# Patient Record
Sex: Male | Born: 1975 | Race: Black or African American | Hispanic: No | Marital: Single | Smoking: Never smoker
Health system: Southern US, Community
[De-identification: ages and names within clinical notes are randomized; demographics above are authoritative.]

## PROBLEM LIST (undated history)

## (undated) DIAGNOSIS — I10 Essential (primary) hypertension: Secondary | ICD-10-CM

---

## 1999-04-06 ENCOUNTER — Emergency Department (HOSPITAL_COMMUNITY): Admission: EM | Admit: 1999-04-06 | Discharge: 1999-04-06 | Payer: Self-pay | Admitting: *Deleted

## 2005-07-06 ENCOUNTER — Encounter: Admission: RE | Admit: 2005-07-06 | Discharge: 2005-07-06 | Payer: Self-pay | Admitting: Orthopedic Surgery

## 2012-04-28 ENCOUNTER — Ambulatory Visit (HOSPITAL_BASED_OUTPATIENT_CLINIC_OR_DEPARTMENT_OTHER): Payer: 59 | Attending: Otolaryngology

## 2012-04-28 VITALS — Ht 66.75 in | Wt 270.0 lb

## 2012-04-28 DIAGNOSIS — G4733 Obstructive sleep apnea (adult) (pediatric): Secondary | ICD-10-CM

## 2012-05-02 DIAGNOSIS — R0609 Other forms of dyspnea: Secondary | ICD-10-CM

## 2012-05-02 DIAGNOSIS — G4733 Obstructive sleep apnea (adult) (pediatric): Secondary | ICD-10-CM

## 2012-05-02 DIAGNOSIS — R0989 Other specified symptoms and signs involving the circulatory and respiratory systems: Secondary | ICD-10-CM

## 2012-05-03 NOTE — Procedures (Signed)
NAME:  Kyle Zavala, Kyle Zavala NO.:  [id number]  MEDICAL RECORD NO.:  [id number]          PATIENT TYPE:  OUT  LOCATION:  SLEEP CENTER                 FACILITY:  [facility]  PHYSICIAN:  Clinton D. Maple Hudson, MD, FCCP, FACPDATE OF BIRTH:  Aug 04[birth year] 1974  DATE OF STUDY:  04/28/2012                           NOCTURNAL POLYSOMNOGRAM  REFERRING PHYSICIAN:  Hermelinda Medicus, M.D.  INDICATION FOR STUDY:  Hypersomnia with sleep apnea.  EPWORTH SLEEPINESS SCORE:  3/24.  BMI 42.3, weight 270 pounds, height 67 inches, neck 17.5 inches.  MEDICATIONS:  Home medications are charted as "none documented."  SLEEP ARCHITECTURE:  Total sleep time 23.5 minutes with sleep efficiency 60.7%.  Stage I was 16.8%, stage II 76.1%, stage III 1.3%.  REM 5.8% of total sleep time.  Sleep latency of 77 minutes.  REM latency 76 minutes. Awake after sleep onset 68.5 minutes.  Arousal index 25.5.  Bedtime medication:  None.  RESPIRATORY DATA:  Apnea-hypopnea index (AHI) 61.7 per hour.  A total of 230 events was scored including 124 obstructive apneas, 10 central apneas, 4 mixed apneas, 92 hypopneas.  Events were not positional.  REM AHI 83.1 per hour.  The patient met criteria for split protocol CPAP titration, but the patient removed the mask and requested the study be continued without CPAP.  OXYGEN DATA:  Loud snoring with oxygen desaturation to a nadir of 77% and mean oxygen saturation through the study of 93.2% on room air.  CARDIAC DATA:  Normal sinus rhythm.  MOVEMENT/PARASOMNIA:  No significant movement disturbance.  Bathroom x1.  IMPRESSION/RECOMMENDATION: 1. Severe obstructive sleep apnea/hypopnea syndrome, AHI 61.7 per hour     with non-positional events.  Loud snoring with oxygen desaturation     to a nadir of 77% and mean oxygen saturation through the study of     93.2% on room air. 2. The patient qualified for a split protocol CPAP titration as     ordered, but took his mask off and asked to be  studied without it.     Clinton D. Maple Hudson, MD, [facility], FACP Diplomate, American Board of Sleep Medicine    CDY/MEDQ  D:  05/02/2012 15:06:13  T:  05/03/2012 00:00:10  Job:  [id number]

## 2018-03-06 ENCOUNTER — Encounter (HOSPITAL_COMMUNITY): Payer: Self-pay

## 2018-03-06 ENCOUNTER — Emergency Department (HOSPITAL_COMMUNITY)
Admission: EM | Admit: 2018-03-06 | Discharge: 2018-03-06 | Disposition: A | Discharge status: Home / Self Care | Payer: Self-pay | Attending: Emergency Medicine | Admitting: Emergency Medicine

## 2018-03-06 ENCOUNTER — Emergency Department (HOSPITAL_COMMUNITY): Payer: Self-pay

## 2018-03-06 DIAGNOSIS — I10 Essential (primary) hypertension: Secondary | ICD-10-CM | POA: Insufficient documentation

## 2018-03-06 DIAGNOSIS — J019 Acute sinusitis, unspecified: Secondary | ICD-10-CM | POA: Insufficient documentation

## 2018-03-06 HISTORY — DX: Essential (primary) hypertension: I10

## 2018-03-06 LAB — I-STAT CHEM 8, ED
BUN: 13 mg/dL (ref 6–20)
Calcium, Ion: 1.2 mmol/L (ref 1.15–1.40)
Chloride: 104 mmol/L (ref 98–111)
Creatinine, Ser: 1.2 mg/dL (ref 0.61–1.24)
Glucose, Bld: 82 mg/dL (ref 70–99)
HCT: 44 % (ref 39.0–52.0)
Hemoglobin: 15 g/dL (ref 13.0–17.0)
Potassium: 3.6 mmol/L (ref 3.5–5.1)
Sodium: 139 mmol/L (ref 135–145)
TCO2: 27 mmol/L (ref 22–32)

## 2018-03-06 LAB — I-STAT TROPONIN, ED: Troponin i, poc: 0 ng/mL (ref 0.00–0.08)

## 2018-03-06 LAB — CBC
HCT: 43.4 % (ref 39.0–52.0)
Hemoglobin: 14 g/dL (ref 13.0–17.0)
MCH: 28.5 pg (ref 26.0–34.0)
MCHC: 32.3 g/dL (ref 30.0–36.0)
MCV: 88.2 fL (ref 80.0–100.0)
Platelets: 305 10*3/uL (ref 150–400)
RBC: 4.92 MIL/uL (ref 4.22–5.81)
RDW: 13.8 % (ref 11.5–15.5)
WBC: 6.9 10*3/uL (ref 4.0–10.5)
nRBC: 0 % (ref 0.0–0.2)

## 2018-03-06 MED ORDER — HYDROXYZINE HCL 25 MG PO TABS
25.0000 mg | ORAL_TABLET | Freq: Once | ORAL | Status: AC
  Administered 2018-03-06: 25 mg via ORAL
  Filled 2018-03-06: qty 1

## 2018-03-06 MED ORDER — HYDROCHLOROTHIAZIDE 12.5 MG PO CAPS: 12.5000 mg | ORAL_CAPSULE | Freq: Every day | ORAL | 0 refills | Status: AC

## 2018-03-06 MED ORDER — DOXYCYCLINE HYCLATE 100 MG PO CAPS: 100.0000 mg | ORAL_CAPSULE | Freq: Two times a day (BID) | ORAL | 0 refills | Status: AC

## 2018-03-06 MED ORDER — ASPIRIN 81 MG PO CHEW
324.0000 mg | CHEWABLE_TABLET | Freq: Once | ORAL | Status: AC
  Administered 2018-03-06: 324 mg via ORAL
  Filled 2018-03-06: qty 4

## 2018-03-06 MED ORDER — FLUTICASONE PROPIONATE 50 MCG/ACT NA SUSP: 1.0000 | Freq: Every day | NASAL | 0 refills | Status: AC

## 2018-03-06 MED ORDER — HYDROCHLOROTHIAZIDE 12.5 MG PO CAPS
12.5000 mg | ORAL_CAPSULE | Freq: Once | ORAL | Status: AC
  Administered 2018-03-06: 12.5 mg via ORAL
  Filled 2018-03-06: qty 1

## 2018-03-06 NOTE — ED Notes (Signed)
This RN has tired BP on left arm, right arm, and left forearm. Also tried large cuff and regular adult long cuff. Also manual BP is documented.

## 2018-03-06 NOTE — ED Triage Notes (Addendum)
Patient c/o stress/anxiety and hypertension on and off for a year. C/o left upper chest pressure.  Denies radiation.   A/Ox4 Ambulatory in triage.   Prescribed amlodipine several years ago but never took it.

## 2018-03-06 NOTE — ED Provider Notes (Signed)
[facility]-EMERGENCY DEPT Provider Note   CSN: [id number] Arrival date & time: 03/06/18  1111     History   Chief Complaint No chief complaint on file.   HPI Kyle Zavala is a 43 y.o. male.  HPI  43 year old male, with a PMH of HTN, presents with high blood pressure.  Patient states he was routinely checking his blood pressure this morning when he was noted his blood pressure was elevated.   Patient states he then started crying and having chest pain.  He describes the pain as a sharp nonradiating pain on the left side of the chest, pinpoint.  He denies any associated shortness of breath, nausea, vomiting.  He states once he stops crying the chest pain improved.  He notes no chest pain at this time.  He states he has been very stressed at home and he thinks this might be worsening his blood pressure.  He has been previously prescribed amlodipine but has never taken it because he is worried about getting the side effects.  Denies any fevers, chills, abdominal pain, nausea, vomiting.  Denies no family history of MI.  He has no personal history of HLD, diabetes, MI.   Past Medical History:  Diagnosis Date  . Hypertension     There are no active problems to display for this patient.   History reviewed. No pertinent surgical history.      Home Medications    Prior to Admission medications   Not on File    Family History History reviewed. No pertinent family history.  Social History Social History   Tobacco Use  . Smoking status: Never Smoker  . Smokeless tobacco: Never Used  Substance Use Topics  . Alcohol use: Not Currently  . Drug use: Never     Allergies   Patient has no allergy information on record.   Review of Systems Review of Systems  Constitutional: Negative for chills and fever.  HENT: Positive for congestion and rhinorrhea.   Respiratory: Positive for cough. Negative for shortness of breath.   Cardiovascular: Positive for  chest pain.  Gastrointestinal: Negative for abdominal pain, nausea and vomiting.  Genitourinary: Negative for dysuria.  Skin: Negative for rash and wound.  Neurological: Negative for numbness.     Physical Exam Updated Vital Signs BP (!) 169/108 (BP Location: Left Arm)   Pulse 66   Temp 97.9 F (36.6 C) (Oral)   Resp 18   SpO2 99%   Physical Exam Vitals signs and nursing note reviewed.  Constitutional:      Appearance: He is well-developed.  HENT:     Head: Normocephalic and atraumatic.     Right Ear: Tympanic membrane normal.     Left Ear: Tympanic membrane normal.     Nose: Congestion and rhinorrhea present. Rhinorrhea is clear.     Mouth/Throat:     Lips: Pink.     Mouth: Mucous membranes are moist.     Pharynx: Oropharynx is clear. Uvula midline.  Eyes:     Conjunctiva/sclera: Conjunctivae normal.  Neck:     Musculoskeletal: Neck supple.  Cardiovascular:     Rate and Rhythm: Normal rate and regular rhythm.     Heart sounds: Normal heart sounds. No murmur.  Pulmonary:     Effort: Pulmonary effort is normal. No respiratory distress.     Breath sounds: Normal breath sounds. No wheezing or rales.  Abdominal:     General: Bowel sounds are normal. There is no distension.  Palpations: Abdomen is soft.     Tenderness: There is no abdominal tenderness.  Musculoskeletal: Normal range of motion.        General: No tenderness or deformity.  Skin:    General: Skin is warm and dry.     Findings: No erythema or rash.  Neurological:     Mental Status: He is alert and oriented to person, place, and time.  Psychiatric:        Behavior: Behavior normal.      ED Treatments / Results  Labs (all labs ordered are listed, but only abnormal results are displayed) Labs Reviewed  CBC  I-STAT TROPONIN, ED  I-STAT CHEM 8, ED    EKG None  Radiology Dg Chest 2 View  Result Date: 03/06/2018 CLINICAL DATA:  The patient reports panic attack today. EXAM: CHEST - 2 VIEW  COMPARISON:  None. FINDINGS: The lungs are clear. Heart size is normal. No pneumothorax or pleural effusion. No acute or focal bony abnormality. IMPRESSION: Negative chest. Electronically Signed   By: Drusilla Kanner M.D.   On: 03/06/2018 18:05    Procedures Procedures (including critical care time)  Medications Ordered in ED Medications  aspirin chewable tablet 324 mg (324 mg Oral Given 03/06/18 1639)  hydrOXYzine (ATARAX/VISTARIL) tablet 25 mg (25 mg Oral Given 03/06/18 1707)  hydrochlorothiazide (MICROZIDE) capsule 12.5 mg (12.5 mg Oral Given 03/06/18 1831)     Initial Impression / Assessment and Plan / ED Course  I have reviewed the triage vital signs and the nursing notes.  Pertinent labs & imaging results that were available during my care of the patient were reviewed by me and considered in my medical decision making (see chart for details).     Presented with hypertension.  Patient resting comfortably in bed, no acute distress, nontoxic, non-lethargic.  Vital signs noted that patient is hypertensive.  History and physical not consistent with ACS.  His chest pain was sharp in nature and he is chest pain-free at this time.  Has been resolved despite his blood pressure remaining elevated, do not believe that he had hypertensive urgency or emergency.  No acute findings on x-ray.  His troponin is negative.  His EKG has no acute ST changes.  His blood work is unremarkable.  Patient requesting provider to start him on blood pressure medication.  Start patient on hydrochlorothiazide he is agreeable with this plan.  He is ready and stable for discharge.  Addendum: After discharge patient requested provider to come back into the room.  He stated for the last week he has had increased rhinorrhea, nasal discharge and cough.  He states he has chronic sinusitis but often gets infections on top of it.  He denies any fevers, chills, sore throat, ear pain.  Given duration of 1 week of symptoms, will try  antibiotic at this time.  Encouraged symptom management.  Patient still stable for discharge.   Final Clinical Impressions(s) / ED Diagnoses   Final diagnoses:  None    ED Discharge Orders    None       Rueben Bash 03/06/18 2150    Arby Barrette, MD 03/08/18 2218

## 2018-03-06 NOTE — Discharge Instructions (Signed)
Take hydrochlorothiazide daily.  Follow-up with primary care for continued monitoring of your blood pressure.  Return to the ED immediately for new or worsening symptoms, such as chest pain, shortness of breath, nausea, vomiting, fevers or any concerns at all.

## 2018-03-06 NOTE — ED Notes (Addendum)
Patient is now requesting to be seen for URI. PA made aware. PA states patient will have to wait until she has seen her other 2 patients first. Patient states he will wait as long as he can.

## 2018-03-06 NOTE — ED Triage Notes (Signed)
Per EMS-coming from doctor's office waiting for family member-states patient started crying for no reason-states increased stressors-no history of anxiety-has not been diagnosed with HTN but thinks he might have it-complainng of headache

## 2019-11-04 IMAGING — DX DG CHEST 2V
2 series · 2 of 2 positions shown · non-contrast
Comparison: None.

CLINICAL DATA: The patient reports panic attack today.

EXAM:
CHEST - 2 VIEW

[chest pa]
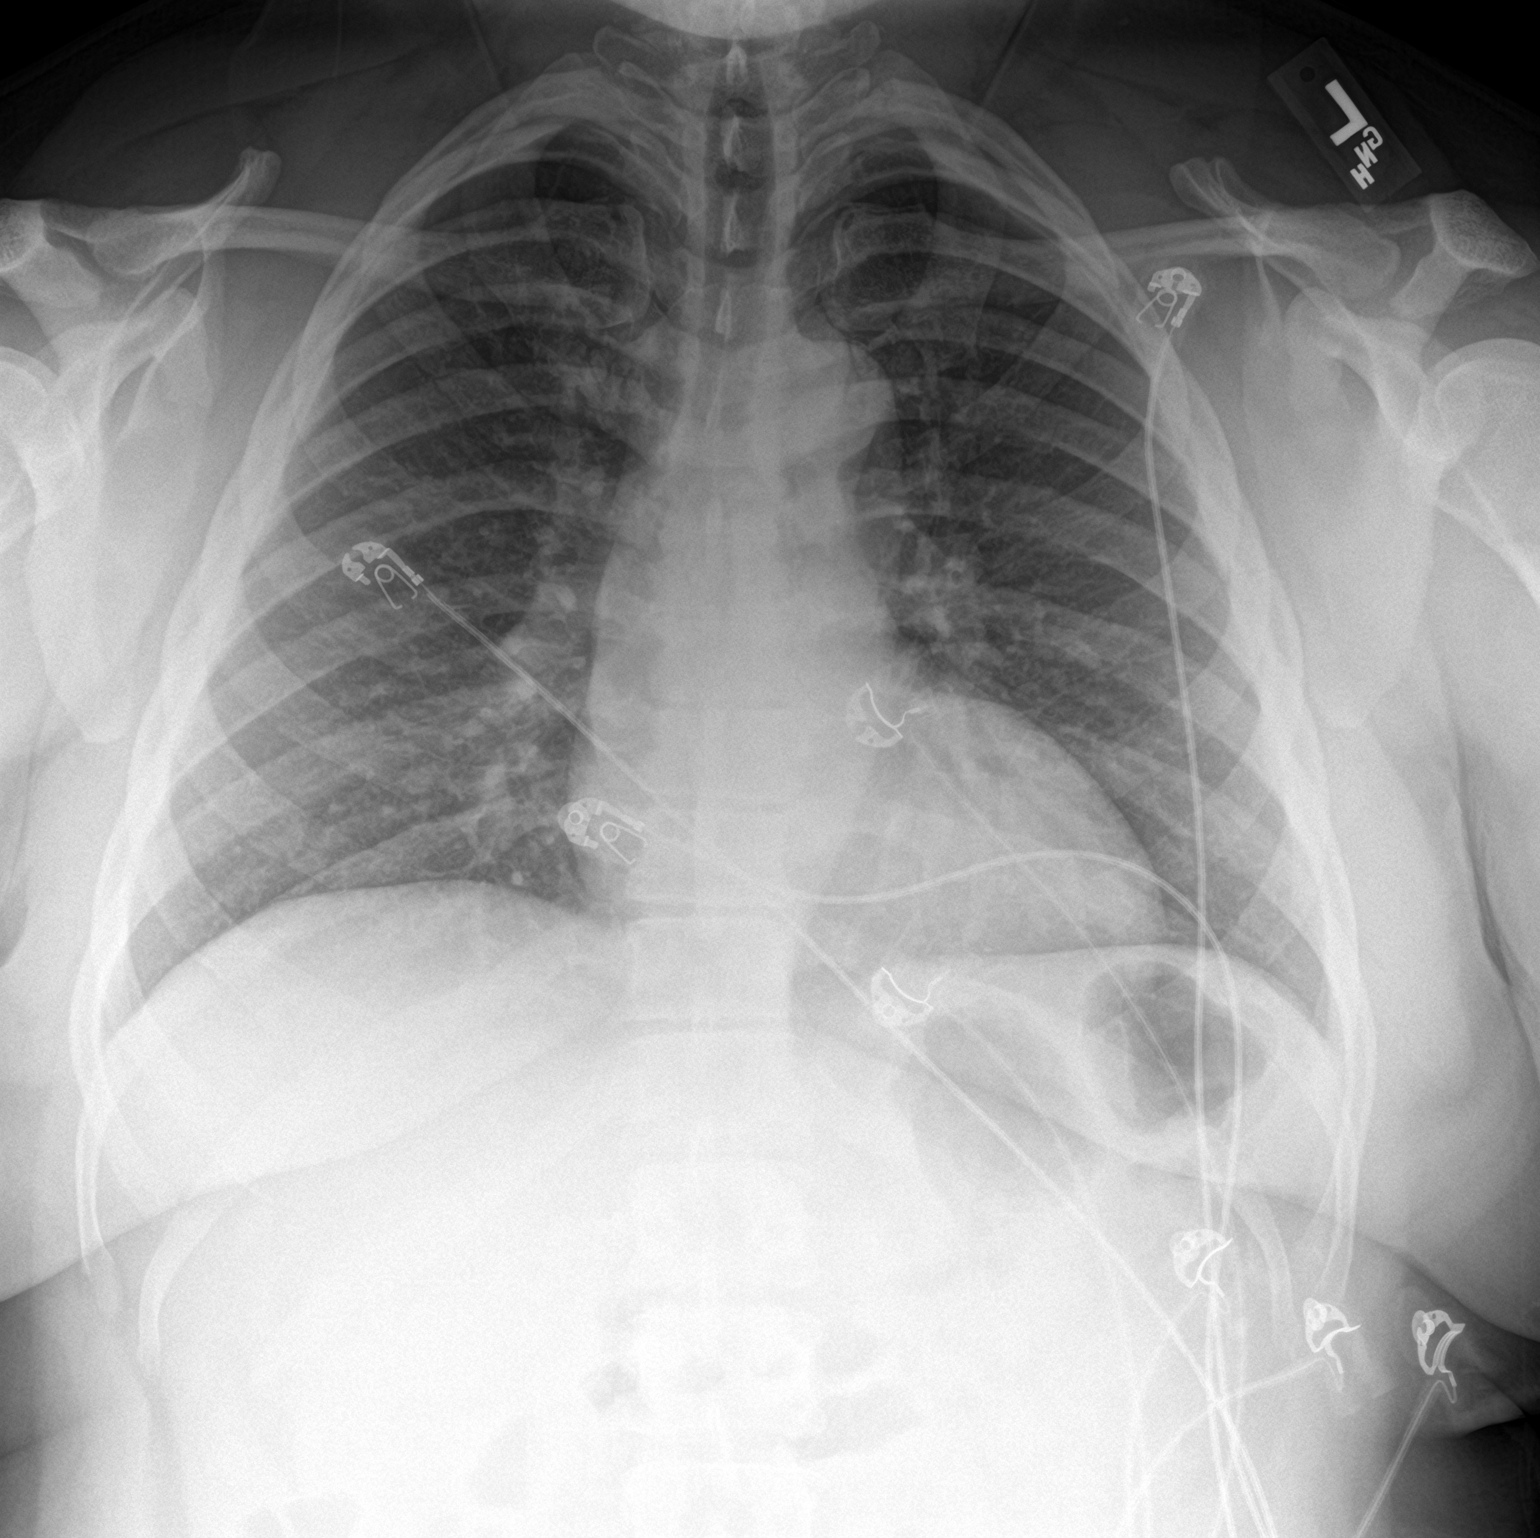

[chest lat]
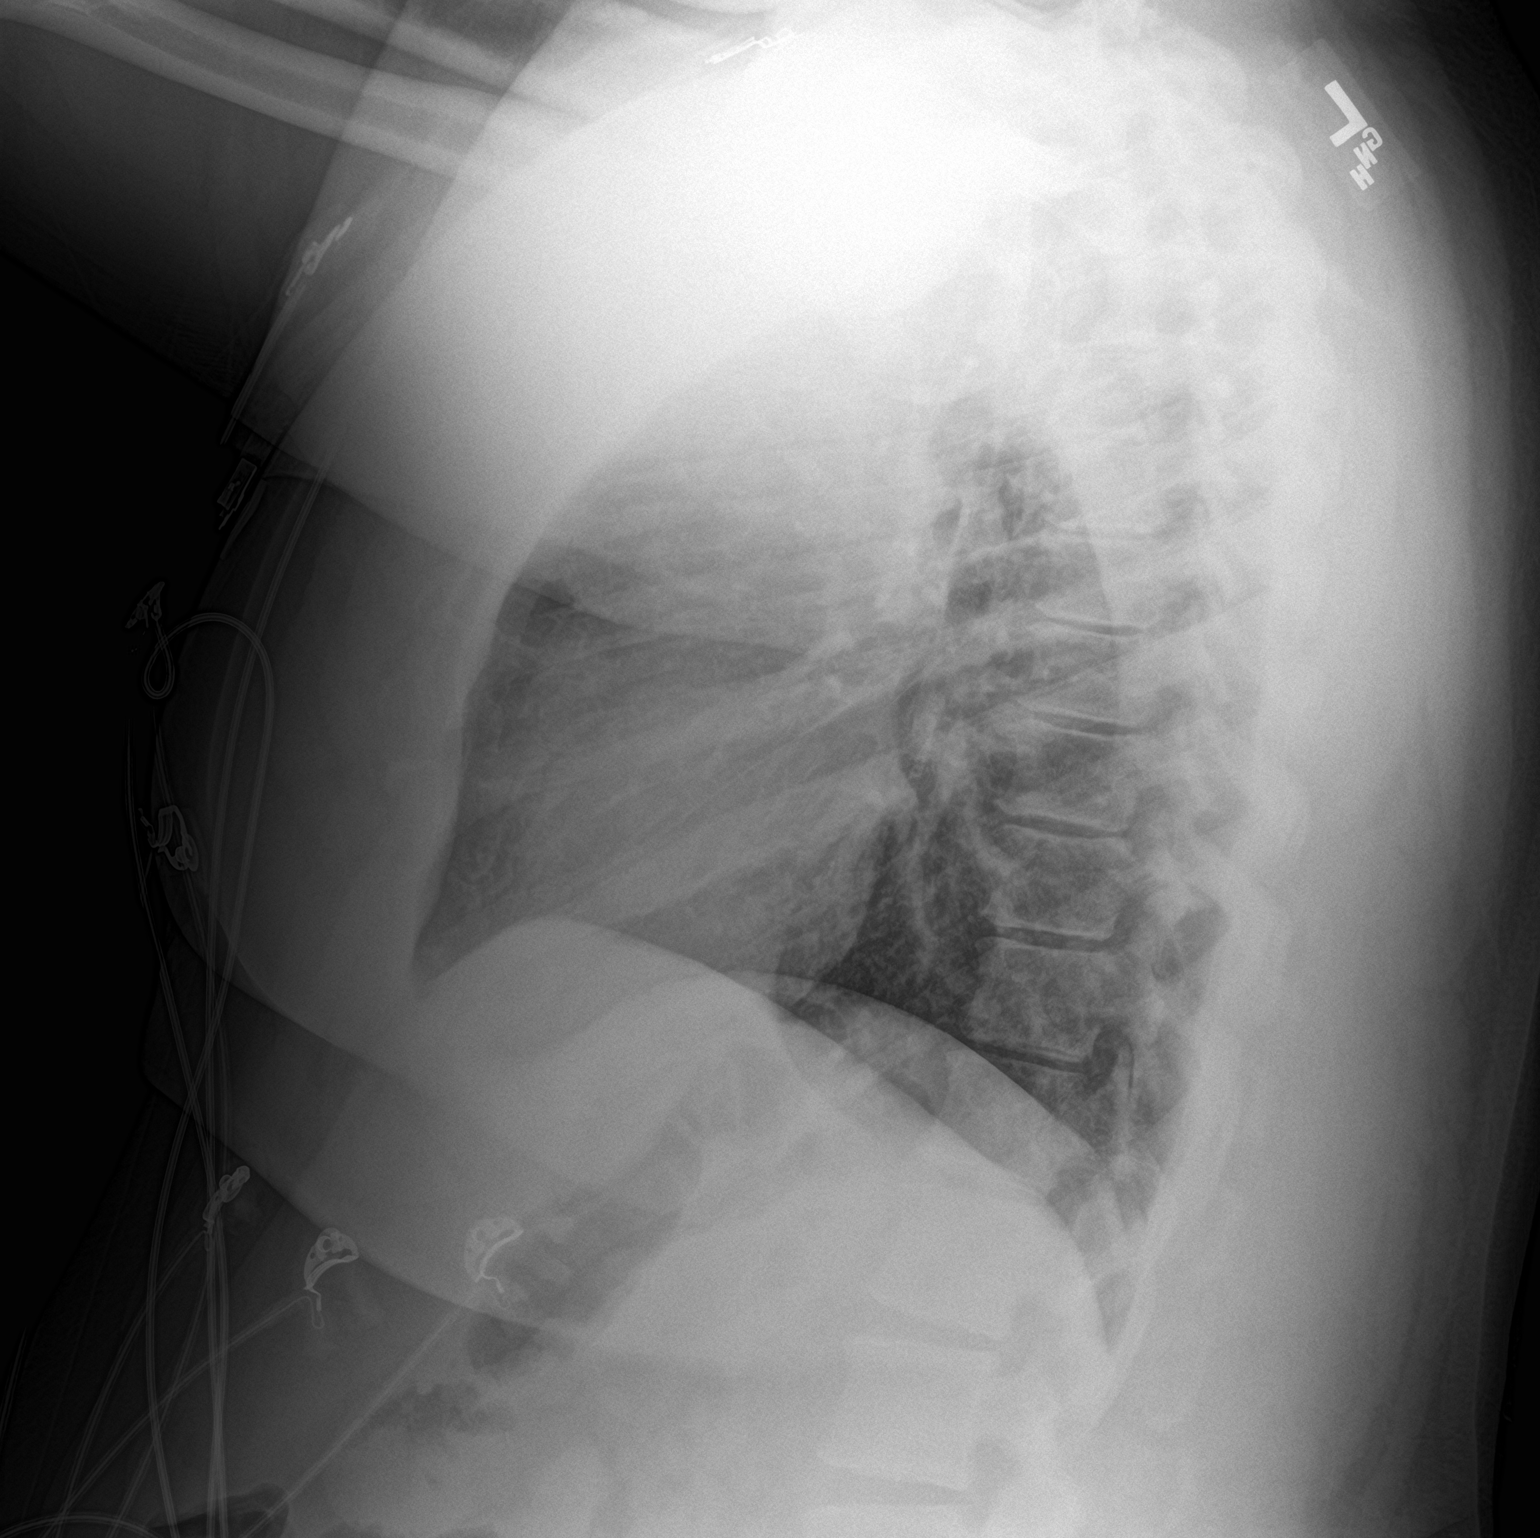

[2 of 2 positions shown; findings below may reference images not displayed]

FINDINGS: The lungs are clear. Heart size is normal. No pneumothorax or
pleural effusion. No acute or focal bony abnormality.
IMPRESSION: Negative chest.

## 2022-10-10 ENCOUNTER — Emergency Department (HOSPITAL_COMMUNITY)
Admission: EM | Admit: 2022-10-10 | Discharge: 2022-10-10 | Disposition: A | Discharge status: Home / Self Care | Payer: Medicaid Other | Attending: Student | Admitting: Student

## 2022-10-10 ENCOUNTER — Other Ambulatory Visit: Payer: Self-pay

## 2022-10-10 DIAGNOSIS — Z79899 Other long term (current) drug therapy: Secondary | ICD-10-CM | POA: Diagnosis not present

## 2022-10-10 DIAGNOSIS — I10 Essential (primary) hypertension: Secondary | ICD-10-CM | POA: Diagnosis not present

## 2022-10-10 DIAGNOSIS — J029 Acute pharyngitis, unspecified: Secondary | ICD-10-CM | POA: Insufficient documentation

## 2022-10-10 DIAGNOSIS — Z1152 Encounter for screening for COVID-19: Secondary | ICD-10-CM | POA: Insufficient documentation

## 2022-10-10 LAB — RESP PANEL BY RT-PCR (RSV, FLU A&B, COVID)  RVPGX2
Influenza A by PCR: NEGATIVE
Influenza B by PCR: NEGATIVE
Resp Syncytial Virus by PCR: NEGATIVE
SARS Coronavirus 2 by RT PCR: NEGATIVE

## 2022-10-10 LAB — GROUP A STREP BY PCR: Group A Strep by PCR: NOT DETECTED

## 2022-10-10 MED ORDER — DEXAMETHASONE SODIUM PHOSPHATE 10 MG/ML IJ SOLN
10.0000 mg | Freq: Once | INTRAMUSCULAR | Status: AC
  Administered 2022-10-10: 10 mg via INTRAMUSCULAR
  Filled 2022-10-10: qty 1

## 2022-10-10 MED ORDER — NAPROXEN 375 MG PO TABS: 375.0000 mg | ORAL_TABLET | Freq: Two times a day (BID) | ORAL | 0 refills | Status: AC

## 2022-10-10 MED ORDER — PENICILLIN G BENZATHINE 1200000 UNIT/2ML IM SUSY
1.2000 10*6.[IU] | PREFILLED_SYRINGE | Freq: Once | INTRAMUSCULAR | Status: AC
  Administered 2022-10-10: 1.2 10*6.[IU] via INTRAMUSCULAR
  Filled 2022-10-10: qty 2

## 2022-10-10 MED ORDER — DEXAMETHASONE 10 MG/ML FOR PEDIATRIC ORAL USE: 10.0000 mg | Freq: Once | INTRAMUSCULAR | Status: DC

## 2022-10-10 MED ORDER — KETOROLAC TROMETHAMINE 15 MG/ML IJ SOLN
15.0000 mg | Freq: Once | INTRAMUSCULAR | Status: AC
  Administered 2022-10-10: 15 mg via INTRAMUSCULAR
  Filled 2022-10-10: qty 1

## 2022-10-10 NOTE — ED Notes (Signed)
Patient verbalizes understanding of discharge instructions. Opportunity for questioning and answers were provided. Armband removed by staff, pt discharged from ED. Pt ambulatory to ED waiting room with steady gait.  

## 2022-10-10 NOTE — ED Triage Notes (Signed)
Pt. Stated, Ive had congestion feeling bad for almost 2 weeks and a bad sore throat for 4 days.

## 2022-10-10 NOTE — ED Provider Notes (Signed)
[place] EMERGENCY DEPARTMENT AT [facility] Provider Note  CSN: [id number] Arrival date & time: 10/10/22 7829  Chief Complaint(s) Sore Throat and Nasal Congestion  HPI Kyle Zavala is a 47 y.o. male with PMH HTN who presents emergency department for evaluation of congestion and sore throat.  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  He states that he has had generalized malaise and fatigue for the last 2 weeks but for the last 4 days has had persistent sore throat.  He has been able to swallow and has full range of motion of his neck.  He denies chest pain, shortness of breath, headache, fever or other systemic symptoms.  Does state that his sister recently endorsed similar symptoms   Past Medical History Past Medical History:  Diagnosis Date   Hypertension    There are no problems to display for this patient.  Home Medication(s) Prior to Admission medications   Medication Sig Start Date End Date Taking? Authorizing Provider  naproxen (NAPROSYN) 375 MG tablet Take 1 tablet (375 mg total) by mouth 2 (two) times daily. 10/10/22  Yes Areg Bialas, MD  fluticasone (FLONASE) 50 MCG/ACT nasal spray Place 1 spray into both nostrils daily for 14 days. 03/06/18 03/20/18  Kendrick, Caitlyn S, PA-C  hydrochlorothiazide (MICROZIDE) 12.5 MG capsule Take 1 capsule (12.5 mg total) by mouth daily. 03/06/18   Clayborne Artist, PA-C                                                                                                                                    Past Surgical History No past surgical history on file. Family History No family history on file.  Social History Social History   Tobacco Use   Smoking status: Never   Smokeless tobacco: Never  Substance Use Topics   Alcohol use: Not Currently   Drug use: Never   Allergies Patient has no allergy information on record.  Review of  Systems Review of Systems  HENT:  Positive for sore throat.   Musculoskeletal:  Positive for arthralgias and myalgias.    Physical Exam Vital Signs  I have reviewed the triage vital signs BP (!) 142/101   Pulse 73   Temp 98.5 F (36.9 C) (Oral)   Resp 18   Ht 5\' 7"  (1.702 m)   Wt 131.5 kg   SpO2 96%   BMI 45.42 kg/m   Physical Exam Vitals and nursing note reviewed.  Constitutional:      General: He is not in acute distress.    Appearance: He is well-developed.  HENT:     Head: Normocephalic and atraumatic.     Mouth/Throat:     Pharynx: Posterior oropharyngeal erythema present.     Tonsils: 1+ on the right. 1+ on the left.  Eyes:     Conjunctiva/sclera: Conjunctivae normal.  Cardiovascular:     Rate and Rhythm: Normal rate and regular rhythm.     Heart sounds: No murmur heard. Pulmonary:     Effort: Pulmonary effort is normal. No respiratory distress.     Breath sounds: Normal breath sounds.  Abdominal:     Palpations: Abdomen is soft.     Tenderness: There is no abdominal tenderness.  Musculoskeletal:        General: No swelling.     Cervical back: Neck supple.  Skin:    General: Skin is warm and dry.     Capillary Refill: Capillary refill takes less than 2 seconds.  Neurological:     Mental Status: He is alert.  Psychiatric:        Mood and Affect: Mood normal.     ED Results and Treatments Labs (all labs ordered are listed, but only abnormal results are displayed) Labs Reviewed  GROUP A STREP BY PCR  RESP PANEL BY RT-PCR (RSV, FLU A&B, COVID)  RVPGX2                                                                                                                          Radiology No results found.  Pertinent labs & imaging results that were available during my care of the patient were reviewed by me and considered in my medical decision making (see MDM for details).  Medications Ordered in ED Medications  penicillin g benzathine (BICILLIN LA)  1200000 UNIT/2ML injection 1.2 Million Units (1.2 Million Units Intramuscular Given 10/10/22 1505)  ketorolac (TORADOL) 15 MG/ML injection 15 mg (15 mg Intramuscular Given 10/10/22 1503)  dexamethasone (DECADRON) injection 10 mg (10 mg Intramuscular Given 10/10/22 1504)                                                                                                                                     Procedures Procedures  (including critical care time)  Medical Decision Making / ED Course   This patient presents to the ED for concern of sore throat, myalgias, this involves an extensive number of treatment options, and is a complaint that carries with it a high risk of complications and morbidity.  The differential diagnosis includes PTA, Retropharyngeal abscess, Ludwig's Angina, Epiglottitis, Bacterial/Viral pharyngitis, Strep Throat, Mononucleosis,  Oral Candidiasis,  MDM: Patient seen emergency room for evaluation of sore throat and myalgias.  Physical exam with 1+ bilateral  tonsillar swelling with oropharyngeal erythema but no uvular deviation or tonsillar exudate.  COVID, flu, RSV negative and obtained in the setting of sore throat with myalgias.  Strep also negative.  Patient given Decadron, Toradol and Bicillin for pharyngitis and is reporting improvement of symptoms.  No evidence of PTA or RPA today.  At this time, patient safe for discharge with outpatient follow-up.  He was given return precautions of which he voiced understanding he was discharged on Naprosyn.   Additional history obtained:  -External records from outside source obtained and reviewed including: Chart review including previous notes, labs, imaging, consultation notes   Lab Tests: -I ordered, reviewed, and interpreted labs.   The pertinent results include:   Labs Reviewed  GROUP A STREP BY PCR  RESP PANEL BY RT-PCR (RSV, FLU A&B, COVID)  [id number]       Medicines ordered and prescription drug management: Meds  ordered this encounter  Medications   penicillin g benzathine (BICILLIN LA) 1200000 UNIT/2ML injection 1.2 Million Units    Order Specific Question:   Antibiotic Indication:    Answer:   Pharyngitis   DISCONTD: dexamethasone (DECADRON) 10 MG/ML injection for Pediatric ORAL use 10 mg   ketorolac (TORADOL) 15 MG/ML injection 15 mg   dexamethasone (DECADRON) injection 10 mg   naproxen (NAPROSYN) 375 MG tablet    Sig: Take 1 tablet (375 mg total) by mouth 2 (two) times daily.    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

## 2022-12-11 ENCOUNTER — Ambulatory Visit (HOSPITAL_COMMUNITY)
Admission: EM | Admit: 2022-12-11 | Discharge: 2022-12-11 | Disposition: A | Discharge status: Home / Self Care | Payer: Medicaid Other

## 2022-12-11 NOTE — Progress Notes (Signed)
   12/11/22 1835  [facility] Triage Screening (Walk-ins at [facility] only)  How Did You Hear About [place]? Self  What Is the Reason for Your Visit/Call Today? Kyle Zavala is a 47 year old male presenting to [facility] unaccompanied. Pt states, "I am extremely stress and I am out of meds (lexipro 20 mg)". 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 Pt does see his therapist weekly and finds it to be helpful. Pt is looking for a medication refill at this time and additional resources to help with his increased anxiety. Pt denies substance use, SI, HI and AVH.  How Long Has This Been Causing You Problems? <Week  Have You Recently Had Any Thoughts About Hurting Yourself? No  Are You Planning to Commit Suicide/Harm Yourself At This time? No  Have you Recently Had Thoughts About Hurting Someone Karolee Ohs? No  Are You Planning To Harm Someone At This Time? No  Are you currently experiencing any auditory, visual or other hallucinations? No  Have You Used Any Alcohol or Drugs in the Past 24 Hours? No  Do you have any current medical co-morbidities that require immediate attention? No  Clinician description of patient physical appearance/behavior: calm, cooperative  What Do You Feel Would Help You the Most Today? Medication(s);Stress Management  If access to [facility] was not available, would you have sought care in the Emergency Department? No  Determination of Need Routine (7 days)  Options For Referral Medication Management

## 2022-12-16 ENCOUNTER — Ambulatory Visit
Admission: EM | Admit: 2022-12-16 | Discharge: 2022-12-16 | Disposition: A | Discharge status: Home / Self Care | Payer: Self-pay

## 2022-12-16 ENCOUNTER — Other Ambulatory Visit: Payer: Self-pay

## 2022-12-16 ENCOUNTER — Encounter: Payer: Self-pay | Admitting: Emergency Medicine

## 2022-12-16 DIAGNOSIS — T7840XA Allergy, unspecified, initial encounter: Secondary | ICD-10-CM

## 2022-12-16 DIAGNOSIS — J069 Acute upper respiratory infection, unspecified: Secondary | ICD-10-CM

## 2022-12-16 MED ORDER — ALBUTEROL SULFATE HFA 108 (90 BASE) MCG/ACT IN AERS: 2.0000 | INHALATION_SPRAY | RESPIRATORY_TRACT | 0 refills | Status: AC | PRN

## 2022-12-16 MED ORDER — ALBUTEROL SULFATE HFA 108 (90 BASE) MCG/ACT IN AERS: 2.0000 | INHALATION_SPRAY | RESPIRATORY_TRACT | 0 refills | Status: DC | PRN

## 2022-12-16 MED ORDER — PROMETHAZINE-DM 6.25-15 MG/5ML PO SYRP: 5.0000 mL | ORAL_SOLUTION | Freq: Four times a day (QID) | ORAL | 0 refills | Status: AC | PRN

## 2022-12-16 MED ORDER — PREDNISONE 10 MG PO TABS: 20.0000 mg | ORAL_TABLET | Freq: Every day | ORAL | 0 refills | Status: AC

## 2022-12-16 NOTE — ED Provider Notes (Signed)
[facility]    CSN: [id number] Arrival date & time: 12/16/22  1500      History   Chief Complaint Chief Complaint  Patient presents with   Cough    HPI Kyle Zavala is a 47 y.o. male.   47 year old male pt, Kyle Zavala, presents to urgent care for evaluation of cough sinus congestion wheezing.  Patient states he is around smoke with his sister and her husband patient denies smoking or vaping himself.  Patient states he has been taken DayQuil and NyQuil and nasal irrigation but is still coughing.  No known illness exposure.   Reports history of using inhaler with URI  The history is provided by the patient. No language interpreter was used.    Past Medical History:  Diagnosis Date   Hypertension     Patient Active Problem List   Diagnosis Date Noted   Viral URI with cough 12/16/2022    History reviewed. No pertinent surgical history.     Home Medications    Prior to Admission medications   Medication Sig Start Date End Date Taking? Authorizing Provider  amLODipine (NORVASC) 10 MG tablet Take 1 tablet by mouth daily. 07/10/22  Yes [provider]  predniSONE (DELTASONE) 10 MG tablet Take 2 tablets (20 mg total) by mouth daily with breakfast for 4 days. X 4 days, Disp # 8 no refills 12/16/22 12/20/22 Yes Kaulin Chaves, Para March, NP  promethazine-dextromethorphan (PROMETHAZINE-DM) 6.25-15 MG/5ML syrup Take 5 mLs by mouth 4 (four) times daily as needed for cough. 12/16/22  Yes Rachelanne Whidby, Para March, NP  albuterol (VENTOLIN HFA) 108 (90 Base) MCG/ACT inhaler Inhale 2 puffs into the lungs every 4 (four) hours as needed for wheezing or shortness of breath. 12/16/22   Maritza Goldsborough, Para March, NP  fluticasone (FLONASE) 50 MCG/ACT nasal spray Place 1 spray into both nostrils daily for 14 days. 03/06/18 03/20/18  Kendrick, Caitlyn S, PA-C  hydrochlorothiazide (MICROZIDE) 12.5 MG capsule Take 1 capsule (12.5 mg total) by mouth daily. 03/06/18   Kendrick, Caitlyn S, PA-C   naproxen (NAPROSYN) 375 MG tablet Take 1 tablet (375 mg total) by mouth 2 (two) times daily. 10/10/22   Kommor, Wyn Forster, MD    Family History History reviewed. No pertinent family history.  Social History Social History   Tobacco Use   Smoking status: Never   Smokeless tobacco: Never  Vaping Use   Vaping status: Never Used  Substance Use Topics   Alcohol use: Not Currently   Drug use: Never     Allergies   Patient has no known allergies.   Review of Systems Review of Systems  Constitutional:  Negative for fever.  HENT:  Positive for congestion.   Respiratory:  Positive for cough and wheezing.   All other systems reviewed and are negative.    Physical Exam Triage Vital Signs ED Triage Vitals  Encounter Vitals Group     BP 12/16/22 1526 (!) 155/76     Systolic BP Percentile --      Diastolic BP Percentile --      Pulse Rate 12/16/22 1526 92     Resp 12/16/22 1526 20     Temp 12/16/22 1526 98.4 F (36.9 C)     Temp Source 12/16/22 1526 Oral     SpO2 12/16/22 1526 96 %     Weight --      Height --      Head Circumference --      Peak Flow --  Pain Score 12/16/22 1523 3     Pain Loc --      Pain Education --      Exclude from Growth Chart --    No data found.  Updated Vital Signs BP (!) 155/76 (BP Location: Left Arm) Comment (BP Location): regular cuff on forearm  Pulse 92   Temp 98.4 F (36.9 C) (Oral)   Resp 20   SpO2 96%   Visual Acuity Right Eye Distance:   Left Eye Distance:   Bilateral Distance:    Right Eye Near:   Left Eye Near:    Bilateral Near:     Physical Exam Vitals and nursing note reviewed.  Constitutional:      General: He is not in acute distress.    Appearance: He is well-developed and well-groomed.  HENT:     Head: Normocephalic and atraumatic.  Eyes:     Conjunctiva/sclera: Conjunctivae normal.  Cardiovascular:     Rate and Rhythm: Normal rate and regular rhythm.     Pulses: Normal pulses.     Heart sounds:  Normal heart sounds. No murmur heard. Pulmonary:     Effort: Pulmonary effort is normal. No respiratory distress.     Breath sounds: Normal breath sounds and air entry.  Abdominal:     Palpations: Abdomen is soft.     Tenderness: There is no abdominal tenderness.  Musculoskeletal:        General: No swelling.     Cervical back: Neck supple.  Skin:    General: Skin is warm and dry.     Capillary Refill: Capillary refill takes less than 2 seconds.  Neurological:     General: No focal deficit present.     Mental Status: He is alert and oriented to person, place, and time.     GCS: GCS eye subscore is 4. GCS verbal subscore is 5. GCS motor subscore is 6.     Cranial Nerves: No cranial nerve deficit.     Sensory: No sensory deficit.  Psychiatric:        Attention and Perception: Attention normal.        Mood and Affect: Mood normal.        Speech: Speech normal.        Behavior: Behavior normal. Behavior is cooperative.      UC Treatments / Results  Labs (all labs ordered are listed, but only abnormal results are displayed) Labs Reviewed - No data to display  EKG   Radiology No results found.  Procedures Procedures (including critical care time)  Medications Ordered in UC Medications - No data to display  Initial Impression / Assessment and Plan / UC Course  I have reviewed the triage vital signs and the nursing notes.  Pertinent labs & imaging results that were available during my care of the patient were reviewed by me and considered in my medical decision making (see chart for details).    Discussed exam findings and plan of care with patient, avoid smoke exposure, take meds as directed, follow-up with PCP, return as needed.  Patient verbalized understanding to this provider.   Ddx: Viral URI with cough, bronchitis, secondhand smoke exposure, allergies Final Clinical Impressions(s) / UC Diagnoses   Final diagnoses:  Viral URI with cough  Allergy, initial  encounter     Discharge Instructions      Most likely you have a viral illness: no antibiotic as indicated at this time, May treat with OTC meds of choice(coricidin HBP,chloraseptic). Make  sure to drink plenty of fluids to stay hydrated(gatorade, water, popsicles,jello,etc), avoid caffeine products. Follow up with PCP. Return as needed.     ED Prescriptions     Medication Sig Dispense Auth. Provider   albuterol (VENTOLIN HFA) 108 (90 Base) MCG/ACT inhaler  (Status: Discontinued) Inhale 2 puffs into the lungs every 4 (four) hours as needed for wheezing or shortness of breath. 1 each Lilyan Prete, NP   promethazine-dextromethorphan (PROMETHAZINE-DM) 6.25-15 MG/5ML syrup Take 5 mLs by mouth 4 (four) times daily as needed for cough. 118 mL Jesalyn Finazzo, NP   predniSONE (DELTASONE) 10 MG tablet Take 2 tablets (20 mg total) by mouth daily with breakfast for 4 days. X 4 days, Disp # 8 no refills 8 tablet Tekoa Amon, NP   albuterol (VENTOLIN HFA) 108 (90 Base) MCG/ACT inhaler Inhale 2 puffs into the lungs every 4 (four) hours as needed for wheezing or shortness of breath. 1 each Toriann Spadoni, Para March, NP      PDMP not reviewed this encounter.   Clancy Gourd, NP 12/16/22 1557

## 2022-12-16 NOTE — ED Triage Notes (Signed)
Symptoms started 10/13.  Complains of cough.  Patient reports he is wheezing.  Patient has sinus drainage and congestion.  Patient has been taking dayquil and nyquil.  Has performed nasal irrigation this morning

## 2022-12-16 NOTE — Discharge Instructions (Signed)
Most likely you have a viral illness: no antibiotic as indicated at this time, May treat with OTC meds of choice(coricidin HBP,chloraseptic). Make sure to drink plenty of fluids to stay hydrated(gatorade, water, popsicles,jello,etc), avoid caffeine products. Follow up with PCP. Return as needed.
